# Patient Record
Sex: Male | Born: 1995 | Race: Black or African American | Hispanic: No | Marital: Single | Smoking: Never smoker
Health system: Southern US, Community
[De-identification: ages and names within clinical notes are randomized; demographics above are authoritative.]

---
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
## 2019-07-18 ENCOUNTER — Other Ambulatory Visit: Payer: Self-pay

## 2019-07-18 DIAGNOSIS — Z20822 Contact with and (suspected) exposure to covid-19: Secondary | ICD-10-CM

## 2019-07-19 LAB — NOVEL CORONAVIRUS, NAA: SARS-CoV-2, NAA: NOT DETECTED

## 2019-11-06 ENCOUNTER — Ambulatory Visit: Payer: Self-pay | Attending: Internal Medicine

## 2019-11-06 DIAGNOSIS — Z20822 Contact with and (suspected) exposure to covid-19: Secondary | ICD-10-CM | POA: Insufficient documentation

## 2019-11-07 LAB — NOVEL CORONAVIRUS, NAA: SARS-CoV-2, NAA: NOT DETECTED

## 2019-11-10 ENCOUNTER — Ambulatory Visit: Payer: Self-pay | Attending: Internal Medicine

## 2019-11-10 DIAGNOSIS — Z23 Encounter for immunization: Secondary | ICD-10-CM

## 2019-11-10 NOTE — Progress Notes (Signed)
   Covid-19 Vaccination Clinic  Name:  Howard Petty    MRN: [id number] DOB: July 20, 1996  11/10/2019  [name] was observed post Covid-19 immunization for 15 minutes without incident. He was provided with Vaccine Information Sheet and instruction to access the V-Safe system.   [name] was instructed to call 911 with any severe reactions post vaccine: Marland Kitchen Difficulty breathing  . Swelling of face and throat  . A fast heartbeat  . A bad rash all over body  . Dizziness and weakness   Immunizations Administered    Name Date Dose VIS Date Route   Pfizer COVID-19 Vaccine 11/10/2019  8:33 AM 0.3 mL 08/04/2019 Intramuscular   Manufacturer: [employer], [employer]   Lot: [id number]   NDC: [address]-0

## 2019-12-05 ENCOUNTER — Ambulatory Visit: Payer: Self-pay | Attending: Internal Medicine

## 2019-12-05 DIAGNOSIS — Z23 Encounter for immunization: Secondary | ICD-10-CM

## 2019-12-05 NOTE — Progress Notes (Signed)
   Covid-19 Vaccination Clinic  Name:  Howard Petty    MRN: [id number] DOB: [birth year]  12/05/2019  Howard Petty was observed post Covid-19 immunization for 15 minutes without incident. He was provided with Vaccine Information Sheet and instruction to access the V-Safe system.   Howard Petty was instructed to call 911 with any severe reactions post vaccine: Marland Kitchen Difficulty breathing  . Swelling of face and throat  . A fast heartbeat  . A bad rash all over body  . Dizziness and weakness   Immunizations Administered    Name Date Dose VIS Date Route   Pfizer COVID-19 Vaccine 12/05/2019 12:22 PM 0.3 mL 08/04/2019 Intramuscular   Manufacturer: [employer], [employer]   Lot: [id number]   NDC: [address]-2

## 2020-08-19 ENCOUNTER — Ambulatory Visit: Payer: Self-pay

## 2020-11-27 ENCOUNTER — Other Ambulatory Visit: Payer: Self-pay

## 2020-11-27 ENCOUNTER — Encounter: Payer: Self-pay | Admitting: Emergency Medicine

## 2020-11-27 ENCOUNTER — Emergency Department
Admission: EM | Admit: 2020-11-27 | Discharge: 2020-11-27 | Disposition: A | Discharge status: Home / Self Care | Payer: Commercial Managed Care - PPO | Attending: Emergency Medicine | Admitting: Emergency Medicine

## 2020-11-27 DIAGNOSIS — M791 Myalgia, unspecified site: Secondary | ICD-10-CM | POA: Diagnosis not present

## 2020-11-27 DIAGNOSIS — Y92410 Unspecified street and highway as the place of occurrence of the external cause: Secondary | ICD-10-CM | POA: Diagnosis not present

## 2020-11-27 MED ORDER — MELOXICAM 7.5 MG PO TABS
15.0000 mg | ORAL_TABLET | Freq: Once | ORAL | Status: AC
  Administered 2020-11-27: 15 mg via ORAL
  Filled 2020-11-27: qty 2

## 2020-11-27 MED ORDER — METHOCARBAMOL 500 MG PO TABS: 500.0000 mg | ORAL_TABLET | Freq: Four times a day (QID) | ORAL | 0 refills | Status: AC

## 2020-11-27 MED ORDER — METHOCARBAMOL 500 MG PO TABS
1000.0000 mg | ORAL_TABLET | Freq: Once | ORAL | Status: AC
  Administered 2020-11-27: 1000 mg via ORAL
  Filled 2020-11-27: qty 2

## 2020-11-27 MED ORDER — MELOXICAM 15 MG PO TABS: 15.0000 mg | ORAL_TABLET | Freq: Every day | ORAL | 0 refills | Status: AC
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
